# Patient Record
Sex: Female | Born: 1985
Health system: Southern US, Community
[De-identification: ages and names within clinical notes are randomized; demographics above are authoritative.]

## PROBLEM LIST (undated history)

## (undated) DIAGNOSIS — F419 Anxiety disorder, unspecified: Secondary | ICD-10-CM

## (undated) DIAGNOSIS — J45909 Unspecified asthma, uncomplicated: Secondary | ICD-10-CM

---

## 2005-01-27 ENCOUNTER — Other Ambulatory Visit: Admission: RE | Admit: 2005-01-27 | Discharge: 2005-01-27 | Payer: Self-pay | Admitting: Obstetrics & Gynecology

## 2009-07-31 ENCOUNTER — Inpatient Hospital Stay (HOSPITAL_COMMUNITY): Admission: AD | Admit: 2009-07-31 | Discharge: 2009-08-03 | Payer: Self-pay | Admitting: Obstetrics and Gynecology

## 2009-09-13 ENCOUNTER — Inpatient Hospital Stay (HOSPITAL_COMMUNITY): Admission: AD | Admit: 2009-09-13 | Discharge: 2009-09-13 | Payer: Self-pay | Admitting: Obstetrics and Gynecology

## 2009-09-24 ENCOUNTER — Inpatient Hospital Stay (HOSPITAL_COMMUNITY): Admission: AD | Admit: 2009-09-24 | Discharge: 2009-09-27 | Payer: Self-pay | Admitting: Obstetrics & Gynecology

## 2009-09-25 ENCOUNTER — Encounter (INDEPENDENT_AMBULATORY_CARE_PROVIDER_SITE_OTHER): Payer: Self-pay | Admitting: Obstetrics & Gynecology

## 2010-09-09 LAB — COMPREHENSIVE METABOLIC PANEL
ALT: 10 U/L (ref 0–35)
AST: 17 U/L (ref 0–37)
Albumin: 2.3 g/dL — ABNORMAL LOW (ref 3.5–5.2)
Albumin: 3 g/dL — ABNORMAL LOW (ref 3.5–5.2)
Alkaline Phosphatase: 119 U/L — ABNORMAL HIGH (ref 39–117)
BUN: 4 mg/dL — ABNORMAL LOW (ref 6–23)
Calcium: 8.5 mg/dL (ref 8.4–10.5)
Calcium: 9.3 mg/dL (ref 8.4–10.5)
Chloride: 106 mEq/L (ref 96–112)
Chloride: 108 mEq/L (ref 96–112)
GFR calc Af Amer: >60 mL/min (ref >60)
GFR calc Af Amer: >60 mL/min (ref >60)
GFR calc non Af Amer: >60 mL/min (ref >60)
GFR calc non Af Amer: >60 mL/min (ref >60)
Glucose, Bld: 124 mg/dL — ABNORMAL HIGH (ref 70–99)
Potassium: 3.8 mEq/L (ref 3.5–5.1)
Sodium: 136 mEq/L (ref 135–145)
Sodium: 137 mEq/L (ref 135–145)
Total Bilirubin: 0.2 mg/dL — ABNORMAL LOW (ref 0.3–1.2)
Total Bilirubin: 0.4 mg/dL (ref 0.3–1.2)
Total Protein: 6 g/dL (ref 6.0–8.3)

## 2010-09-09 LAB — CBC
HCT: 31.5 % — ABNORMAL LOW (ref 36.0–46.0)
HCT: 38.3 % (ref 36.0–46.0)
Hemoglobin: 10.5 g/dL — ABNORMAL LOW (ref 12.0–15.0)
Hemoglobin: 12.9 g/dL (ref 12.0–15.0)
MCHC: 33 g/dL (ref 30.0–36.0)
MCHC: 33.7 g/dL (ref 30.0–36.0)
Platelets: 122 10*3/uL — ABNORMAL LOW (ref 150–400)
RBC: 4.03 MIL/uL (ref 3.87–5.11)
RDW: 13.7 % (ref 11.5–15.5)
WBC: 12.6 10*3/uL — ABNORMAL HIGH (ref 4.0–10.5)

## 2010-09-09 LAB — RPR: RPR Ser Ql: NONREACTIVE

## 2010-09-09 LAB — URIC ACID: Uric Acid, Serum: 6.4 mg/dL (ref 2.4–7.0)

## 2010-09-10 LAB — WET PREP, GENITAL: Trich, Wet Prep: NONE SEEN

## 2010-09-10 LAB — URINALYSIS, ROUTINE W REFLEX MICROSCOPIC
Glucose, UA: NEGATIVE mg/dL
Hgb urine dipstick: NEGATIVE
Ketones, ur: NEGATIVE mg/dL
Nitrite: NEGATIVE
Protein, ur: NEGATIVE mg/dL

## 2010-09-10 LAB — STREP B DNA PROBE: Strep Group B Ag: POSITIVE

## 2010-09-10 LAB — URINALYSIS, MICROSCOPIC ONLY
Bilirubin Urine: NEGATIVE
Ketones, ur: NEGATIVE mg/dL
Specific Gravity, Urine: 1.025 (ref 1.005–1.030)
pH: 6 (ref 5.0–8.0)

## 2010-09-10 LAB — URINE CULTURE

## 2012-05-24 ENCOUNTER — Other Ambulatory Visit: Payer: Self-pay | Admitting: Internal Medicine

## 2013-06-21 NOTE — L&D Delivery Note (Signed)
Delivery Note  SVD viable female Apgars 8,9 over 2nd degree ML laceration.  Placenta delivered spontaneously intact with 3VC. Repair with 2-0 Chromic with good support and hemostasis noted and R/V exam confirms.  PH art was sent.  [place] cord blood was not done.  Mother and baby were doing well.  EBL 300cc  Candice Camp, MD

## 2013-07-17 LAB — OB RESULTS CONSOLE ABO/RH: RH TYPE: POSITIVE

## 2013-07-17 LAB — OB RESULTS CONSOLE RPR: RPR: NONREACTIVE

## 2013-07-17 LAB — OB RESULTS CONSOLE RUBELLA ANTIBODY, IGM: RUBELLA: IMMUNE

## 2013-07-17 LAB — OB RESULTS CONSOLE HIV ANTIBODY (ROUTINE TESTING): HIV: NONREACTIVE

## 2013-07-17 LAB — OB RESULTS CONSOLE HEPATITIS B SURFACE ANTIGEN: Hepatitis B Surface Ag: NEGATIVE

## 2013-07-17 LAB — OB RESULTS CONSOLE ANTIBODY SCREEN: Antibody Screen: NEGATIVE

## 2013-07-30 LAB — OB RESULTS CONSOLE GC/CHLAMYDIA
Chlamydia: NEGATIVE
Gonorrhea: NEGATIVE

## 2014-01-31 LAB — OB RESULTS CONSOLE GBS: STREP GROUP B AG: POSITIVE

## 2014-02-20 ENCOUNTER — Inpatient Hospital Stay (HOSPITAL_COMMUNITY)
Admission: AD | Admit: 2014-02-20 | Discharge: 2014-02-22 | DRG: 775 | Disposition: A | Discharge status: Home / Self Care | Payer: 59 | Source: Ambulatory Visit | Attending: Obstetrics and Gynecology | Admitting: Obstetrics and Gynecology

## 2014-02-20 ENCOUNTER — Encounter (HOSPITAL_COMMUNITY): Payer: Self-pay | Admitting: *Deleted

## 2014-02-20 ENCOUNTER — Encounter (HOSPITAL_COMMUNITY): Payer: 59 | Admitting: Anesthesiology

## 2014-02-20 ENCOUNTER — Inpatient Hospital Stay (HOSPITAL_COMMUNITY): Payer: 59 | Admitting: Anesthesiology

## 2014-02-20 DIAGNOSIS — Z22330 Carrier of Group B streptococcus: Secondary | ICD-10-CM | POA: Diagnosis not present

## 2014-02-20 DIAGNOSIS — D689 Coagulation defect, unspecified: Secondary | ICD-10-CM | POA: Diagnosis present

## 2014-02-20 DIAGNOSIS — O9989 Other specified diseases and conditions complicating pregnancy, childbirth and the puerperium: Secondary | ICD-10-CM

## 2014-02-20 DIAGNOSIS — Z3490 Encounter for supervision of normal pregnancy, unspecified, unspecified trimester: Secondary | ICD-10-CM

## 2014-02-20 DIAGNOSIS — O99892 Other specified diseases and conditions complicating childbirth: Secondary | ICD-10-CM | POA: Diagnosis present

## 2014-02-20 DIAGNOSIS — O99891 Other specified diseases and conditions complicating pregnancy: Secondary | ICD-10-CM | POA: Diagnosis present

## 2014-02-20 DIAGNOSIS — O9912 Other diseases of the blood and blood-forming organs and certain disorders involving the immune mechanism complicating childbirth: Secondary | ICD-10-CM

## 2014-02-20 DIAGNOSIS — D696 Thrombocytopenia, unspecified: Secondary | ICD-10-CM | POA: Diagnosis present

## 2014-02-20 HISTORY — DX: Anxiety disorder, unspecified: F41.9

## 2014-02-20 HISTORY — DX: Unspecified asthma, uncomplicated: J45.909

## 2014-02-20 LAB — CBC
HEMATOCRIT: 37 % (ref 36.0–46.0)
HEMOGLOBIN: 12.5 g/dL (ref 12.0–15.0)
MCH: 30.9 pg (ref 26.0–34.0)
MCHC: 33.8 g/dL (ref 30.0–36.0)
MCV: 91.6 fL (ref 78.0–100.0)
Platelets: 136 10*3/uL — ABNORMAL LOW (ref 150–400)
RBC: 4.04 MIL/uL (ref 3.87–5.11)
RDW: 13.7 % (ref 11.5–15.5)
WBC: 10.3 10*3/uL (ref 4.0–10.5)

## 2014-02-20 LAB — SAMPLE TO BLOOD BANK

## 2014-02-20 LAB — POCT FERN TEST: POCT Fern Test: POSITIVE

## 2014-02-20 MED ORDER — DIPHENHYDRAMINE HCL 50 MG/ML IJ SOLN: 12.5000 mg | INTRAMUSCULAR | Status: DC | PRN

## 2014-02-20 MED ORDER — FENTANYL 2.5 MCG/ML BUPIVACAINE 1/10 % EPIDURAL INFUSION (WH - ANES)
14.0000 mL/h | INTRAMUSCULAR | Status: DC | PRN
  Administered 2014-02-20 (×2): 14 mL/h via EPIDURAL
  Filled 2014-02-20 (×2): qty 125

## 2014-02-20 MED ORDER — PENICILLIN G POTASSIUM 5000000 UNITS IJ SOLR
2.5000 10*6.[IU] | INTRAVENOUS | Status: DC
  Administered 2014-02-20: 2.5 10*6.[IU] via INTRAVENOUS
  Filled 2014-02-20 (×3): qty 2.5

## 2014-02-20 MED ORDER — LIDOCAINE HCL (PF) 1 % IJ SOLN
INTRAMUSCULAR | Status: DC | PRN
  Administered 2014-02-20 (×4): 4 mL

## 2014-02-20 MED ORDER — LIDOCAINE HCL (PF) 1 % IJ SOLN
30.0000 mL | INTRAMUSCULAR | Status: DC | PRN
  Filled 2014-02-20: qty 30

## 2014-02-20 MED ORDER — OXYCODONE-ACETAMINOPHEN 5-325 MG PO TABS: 2.0000 | ORAL_TABLET | ORAL | Status: DC | PRN

## 2014-02-20 MED ORDER — TERBUTALINE SULFATE 1 MG/ML IJ SOLN: 0.2500 mg | Freq: Once | INTRAMUSCULAR | Status: AC | PRN

## 2014-02-20 MED ORDER — ACETAMINOPHEN 325 MG PO TABS: 650.0000 mg | ORAL_TABLET | ORAL | Status: DC | PRN

## 2014-02-20 MED ORDER — LACTATED RINGERS IV SOLN
INTRAVENOUS | Status: DC
  Administered 2014-02-20 (×2): via INTRAVENOUS

## 2014-02-20 MED ORDER — OXYTOCIN 40 UNITS IN LACTATED RINGERS INFUSION - SIMPLE MED
62.5000 mL/h | INTRAVENOUS | Status: DC
  Administered 2014-02-20: 62.5 mL/h via INTRAVENOUS

## 2014-02-20 MED ORDER — OXYTOCIN 40 UNITS IN LACTATED RINGERS INFUSION - SIMPLE MED
1.0000 m[IU]/min | INTRAVENOUS | Status: DC
  Administered 2014-02-20: 2 m[IU]/min via INTRAVENOUS
  Filled 2014-02-20: qty 1000

## 2014-02-20 MED ORDER — CITRIC ACID-SODIUM CITRATE 334-500 MG/5ML PO SOLN
30.0000 mL | ORAL | Status: DC | PRN
Start: 2014-02-20 — End: 2014-02-21

## 2014-02-20 MED ORDER — LACTATED RINGERS IV SOLN: 500.0000 mL | Freq: Once | INTRAVENOUS | Status: DC

## 2014-02-20 MED ORDER — PHENYLEPHRINE 40 MCG/ML (10ML) SYRINGE FOR IV PUSH (FOR BLOOD PRESSURE SUPPORT)
80.0000 ug | PREFILLED_SYRINGE | INTRAVENOUS | Status: DC | PRN
  Filled 2014-02-20: qty 2

## 2014-02-20 MED ORDER — ONDANSETRON HCL 4 MG/2ML IJ SOLN
4.0000 mg | Freq: Four times a day (QID) | INTRAMUSCULAR | Status: DC | PRN
  Administered 2014-02-20: 4 mg via INTRAVENOUS
  Filled 2014-02-20: qty 2

## 2014-02-20 MED ORDER — OXYCODONE-ACETAMINOPHEN 5-325 MG PO TABS: 1.0000 | ORAL_TABLET | ORAL | Status: DC | PRN

## 2014-02-20 MED ORDER — EPHEDRINE 5 MG/ML INJ
10.0000 mg | INTRAVENOUS | Status: DC | PRN
  Filled 2014-02-20: qty 2

## 2014-02-20 MED ORDER — LACTATED RINGERS IV SOLN
500.0000 mL | INTRAVENOUS | Status: DC | PRN
  Administered 2014-02-20: 500 mL via INTRAVENOUS

## 2014-02-20 MED ORDER — PENICILLIN G POTASSIUM 5000000 UNITS IJ SOLR
5.0000 10*6.[IU] | Freq: Once | INTRAVENOUS | Status: AC
  Administered 2014-02-20: 5 10*6.[IU] via INTRAVENOUS
  Filled 2014-02-20: qty 5

## 2014-02-20 MED ORDER — FLEET ENEMA 7-19 GM/118ML RE ENEM: 1.0000 | ENEMA | RECTAL | Status: DC | PRN

## 2014-02-20 MED ORDER — OXYTOCIN BOLUS FROM INFUSION: 500.0000 mL | INTRAVENOUS | Status: DC

## 2014-02-20 MED ORDER — PHENYLEPHRINE 40 MCG/ML (10ML) SYRINGE FOR IV PUSH (FOR BLOOD PRESSURE SUPPORT)
80.0000 ug | PREFILLED_SYRINGE | INTRAVENOUS | Status: DC | PRN
  Filled 2014-02-20 (×2): qty 10
  Filled 2014-02-20: qty 2

## 2014-02-20 NOTE — H&P (Signed)
Priscilla Porter is a 28 y.o. female presenting for SROM clear fluid 11 am.  Now with ctxs increasing in Freq and Strength.  Was seen in office yesterday and was 2/80 and had membranes stripped by Dr Vincente Poli.  GBS +,  Pregnancy uncomplicated. History OB History   Grav Para Term Preterm Abortions TAB SAB Ect Mult Living   1              No past medical history on file. No past surgical history on file. Family History: family history is not on file. Social History:  has no tobacco, alcohol, and drug history on file.   Prenatal Transfer Tool  Maternal Diabetes: No Genetic Screening: Normal Maternal Ultrasounds/Referrals: Normal Fetal Ultrasounds or other Referrals:  None Maternal Substance Abuse:  No Significant Maternal Medications:  None Significant Maternal Lab Results:  None Other Comments:  None  ROS    Blood pressure 128/80, pulse 110, temperature 98.7 F (37.1 C), temperature source Oral, resp. rate 18, height  (1.702 m), weight 94.802 kg (209 lb). Exam Physical Exam  Prenatal labs: ABO, Rh: O/Positive/-- (01/27 0000) Antibody: Negative (01/27 0000) Rubella: Immune (01/27 0000) RPR: Nonreactive (01/27 0000)  HBsAg: Negative (01/27 0000)  HIV: Non-reactive (01/27 0000)  GBS:     Assessment/Plan: IUP at term in early labor with SROM IV Abx for GBS Augment with pitocin as needed. Anticipate SVD   Suhaib Guzzo C 02/20/2014, 2:03 PM

## 2014-02-20 NOTE — Anesthesia Preprocedure Evaluation (Signed)
Anesthesia Evaluation  Patient identified by MRN, date of birth, ID band Patient awake    Reviewed: Allergy & Precautions, H&P , NPO status , Patient's Chart, lab work & pertinent test results, reviewed documented beta blocker date and time   History of Anesthesia Complications Negative for: history of anesthetic complications  Airway Mallampati: II TM Distance: >3 FB Neck ROM: full    Dental  (+) Teeth Intact   Pulmonary asthma (mild) ,  breath sounds clear to auscultation        Cardiovascular negative cardio ROS  Rhythm:regular Rate:Normal     Neuro/Psych negative neurological ROS  negative psych ROS   GI/Hepatic negative GI ROS, Neg liver ROS,   Endo/Other  negative endocrine ROS  Renal/GU negative Renal ROS     Musculoskeletal   Abdominal   Peds  Hematology negative hematology ROS (+) Thrombocytopenia - plt 136   Anesthesia Other Findings   Reproductive/Obstetrics (+) Pregnancy                           Anesthesia Physical Anesthesia Plan  ASA: II  Anesthesia Plan: Epidural   Post-op Pain Management:    Induction:   Airway Management Planned:   Additional Equipment:   Intra-op Plan:   Post-operative Plan:   Informed Consent: I have reviewed the patients History and Physical, chart, labs and discussed the procedure including the risks, benefits and alternatives for the proposed anesthesia with the patient or authorized representative who has indicated his/her understanding and acceptance.     Plan Discussed with:   Anesthesia Plan Comments:         Anesthesia Quick Evaluation

## 2014-02-20 NOTE — MAU Note (Signed)
Pt reports her water broke at 1145. Fluid is clear. Having ctx that feel like they are getting stronger. Good fetal movement reported.

## 2014-02-20 NOTE — Anesthesia Procedure Notes (Signed)
Epidural Patient location during procedure: OB Start time: 02/20/2014 4:27 PM  Staffing Anesthesiologist: Dajane Valli Performed by: anesthesiologist   Preanesthetic Checklist Completed: patient identified, site marked, surgical consent, pre-op evaluation, timeout performed, IV checked, risks and benefits discussed and monitors and equipment checked  Epidural Patient position: sitting Prep: site prepped and draped and DuraPrep Patient monitoring: continuous pulse ox and blood pressure Approach: midline Location: L3-L4 Injection technique: LOR air  Needle:  Needle type: Tuohy  Needle gauge: 17 G Needle length: 9 cm and 9 Needle insertion depth: 5.5 cm Catheter type: closed end flexible Catheter size: 19 Gauge Catheter at skin depth: 10.5 cm Test dose: negative  Assessment Events: blood not aspirated, injection not painful, no injection resistance, negative IV test and no paresthesia  Additional Notes Discussed risk of headache, infection, bleeding, nerve injury and failed or incomplete block.  Patient voices understanding and wishes to proceed.  Epidural placed easily on first attempt.  No paresthesia.  Patient tolerated procedure well with no apparent complications.  Jasmine December, MDReason for block:procedure for pain

## 2014-02-21 ENCOUNTER — Encounter (HOSPITAL_COMMUNITY): Payer: Self-pay | Admitting: *Deleted

## 2014-02-21 LAB — CBC
HCT: 34.4 % — ABNORMAL LOW (ref 36.0–46.0)
Hemoglobin: 11.6 g/dL — ABNORMAL LOW (ref 12.0–15.0)
MCH: 30.8 pg (ref 26.0–34.0)
MCHC: 33.7 g/dL (ref 30.0–36.0)
MCV: 91.2 fL (ref 78.0–100.0)
Platelets: 136 K/uL — ABNORMAL LOW (ref 150–400)
RBC: 3.77 MIL/uL — ABNORMAL LOW (ref 3.87–5.11)
RDW: 13.6 % (ref 11.5–15.5)
WBC: 16.6 K/uL — ABNORMAL HIGH (ref 4.0–10.5)

## 2014-02-21 LAB — RPR

## 2014-02-21 MED ORDER — ZOLPIDEM TARTRATE 5 MG PO TABS: 5.0000 mg | ORAL_TABLET | Freq: Every evening | ORAL | Status: DC | PRN

## 2014-02-21 MED ORDER — LANOLIN HYDROUS EX OINT: TOPICAL_OINTMENT | CUTANEOUS | Status: DC | PRN

## 2014-02-21 MED ORDER — BENZOCAINE-MENTHOL 20-0.5 % EX AERO
1.0000 | INHALATION_SPRAY | CUTANEOUS | Status: DC | PRN
Start: 2014-02-21 — End: 2014-02-22

## 2014-02-21 MED ORDER — MEASLES, MUMPS & RUBELLA VAC ~~LOC~~ INJ
0.5000 mL | INJECTION | Freq: Once | SUBCUTANEOUS | Status: DC
  Filled 2014-02-21: qty 0.5

## 2014-02-21 MED ORDER — MEDROXYPROGESTERONE ACETATE 150 MG/ML IM SUSP: 150.0000 mg | INTRAMUSCULAR | Status: DC | PRN

## 2014-02-21 MED ORDER — ONDANSETRON HCL 4 MG/2ML IJ SOLN
4.0000 mg | INTRAMUSCULAR | Status: DC | PRN
Start: 2014-02-21 — End: 2014-02-22

## 2014-02-21 MED ORDER — IBUPROFEN 600 MG PO TABS
600.0000 mg | ORAL_TABLET | Freq: Four times a day (QID) | ORAL | Status: DC
  Administered 2014-02-21 – 2014-02-22 (×6): 600 mg via ORAL
  Filled 2014-02-21 (×6): qty 1

## 2014-02-21 MED ORDER — TETANUS-DIPHTH-ACELL PERTUSSIS 5-2.5-18.5 LF-MCG/0.5 IM SUSP: 0.5000 mL | Freq: Once | INTRAMUSCULAR | Status: DC

## 2014-02-21 MED ORDER — WITCH HAZEL-GLYCERIN EX PADS
1.0000 | MEDICATED_PAD | CUTANEOUS | Status: DC | PRN
Start: 2014-02-21 — End: 2014-02-22

## 2014-02-21 MED ORDER — ALBUTEROL SULFATE (2.5 MG/3ML) 0.083% IN NEBU: 3.0000 mL | INHALATION_SOLUTION | Freq: Four times a day (QID) | RESPIRATORY_TRACT | Status: DC | PRN

## 2014-02-21 MED ORDER — DIPHENHYDRAMINE HCL 25 MG PO CAPS: 25.0000 mg | ORAL_CAPSULE | Freq: Four times a day (QID) | ORAL | Status: DC | PRN

## 2014-02-21 MED ORDER — OXYCODONE-ACETAMINOPHEN 5-325 MG PO TABS: 2.0000 | ORAL_TABLET | ORAL | Status: DC | PRN

## 2014-02-21 MED ORDER — SIMETHICONE 80 MG PO CHEW
80.0000 mg | CHEWABLE_TABLET | ORAL | Status: DC | PRN
Start: 2014-02-21 — End: 2014-02-22

## 2014-02-21 MED ORDER — DIBUCAINE 1 % RE OINT
1.0000 | TOPICAL_OINTMENT | RECTAL | Status: DC | PRN
Start: 2014-02-21 — End: 2014-02-22

## 2014-02-21 MED ORDER — OXYCODONE-ACETAMINOPHEN 5-325 MG PO TABS: 1.0000 | ORAL_TABLET | ORAL | Status: DC | PRN

## 2014-02-21 MED ORDER — ONDANSETRON HCL 4 MG PO TABS
4.0000 mg | ORAL_TABLET | ORAL | Status: DC | PRN
Start: 2014-02-21 — End: 2014-02-22

## 2014-02-21 MED ORDER — SENNOSIDES-DOCUSATE SODIUM 8.6-50 MG PO TABS
2.0000 | ORAL_TABLET | ORAL | Status: DC
  Administered 2014-02-21 (×2): 2 via ORAL
  Filled 2014-02-21 (×2): qty 2

## 2014-02-21 MED ORDER — PRENATAL MULTIVITAMIN CH
1.0000 | ORAL_TABLET | Freq: Every day | ORAL | Status: DC
  Administered 2014-02-21 – 2014-02-22 (×2): 1 via ORAL
  Filled 2014-02-21 (×2): qty 1

## 2014-02-21 NOTE — Lactation Note (Signed)
This note was copied from the chart of Priscilla Porter. Lactation Consultation Note  Patient Name: Priscilla Alayja Armas [id number] Date: 02/21/2014 Reason for consult: Initial assessment Baby 16 hours of life. Mom reports baby has been sleeping most of the day and baby has not been to breast since this morning. Enc mom to offer baby lots of STS and feed with cues and at least 8-12 times/24 hours. Assisted mom to latch baby to right breast in football position. Mom able to hand express colostrum. Mom reports baby has more difficult time latching to right breast. Right nipple is shorter/does not evert as much as left. After about 5/6 attempts, baby latches deeply, suckling rhythmically with few swallows noted. Demonstrated to mom how to compress breast for baby to latch. Discussed cluster-feeding with parents. Mom given [facility] brochure, aware of OP/BFSG and community resources. Enc mom to call for assistance with latching as needed.   Maternal Data Has patient been taught Hand Expression?: Yes Does the patient have breastfeeding experience prior to this delivery?: Yes  Feeding Feeding Type: Breast Fed Length of feed:  (LC assessed first 15 minutes of bf. )  LATCH Score/Interventions Latch: Grasps breast easily, tongue down, lips flanged, rhythmical sucking. Intervention(s): Adjust position;Assist with latch;Breast compression  Audible Swallowing: A few with stimulation  Type of Nipple: Everted at rest and after stimulation  Comfort (Breast/Nipple): Soft / non-tender     Hold (Positioning): No assistance needed to correctly position infant at breast. Intervention(s): Support Pillows;Position options;Breastfeeding basics reviewed  LATCH Score: 9  Lactation Tools Discussed/Used     Consult Status Consult Status: Follow-up Date: 02/22/14 Follow-up type: In-patient    Geralynn Ochs 02/21/2014, 3:15 PM

## 2014-02-21 NOTE — Progress Notes (Signed)
Clinical Social Work Department PSYCHOSOCIAL ASSESSMENT - MATERNAL/CHILD 02/21/2014  Patient:  Priscilla Porter, Priscilla Porter  Account Number:  [id number]  Admit Date:  02/20/2014  Ardine Eng Name:   Asencion Partridge   Clinical Social Worker:  Lucita Ferrara, CLINICAL SOCIAL WORKER   Date/Time:  02/21/2014 03:00 PM  Date Referred:  02/21/2014   Referral source  Central Nursery     Referred reason  Depression/Anxiety   Other referral source:    I:  FAMILY / HOME ENVIRONMENT Child's legal guardian:  PARENT  Guardian - Name Guardian - Age Guardian - Address  Patience Nuzzo [address], [place], [place] [address]  Georgann Housekeeper  Same as above   Other household support members/support persons Name Relationship DOB  Al Decant April 2011   Other support:   MOB shared that they have numerous family members who are supportive and live nearby.  MGM present during the assessment with MOB's consent, and attentive as CSW discussed sgns and symptoms of postpartum depression.    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  FOB is a Airline pilot.  They discussed impressions that they are well supported by their employers and both have the ability to stay at home as they transition home.   Financial resources:  Multimedia programmer If [place]:    School / Grade:  N/A Music therapist / Child Services Coordination / Early Interventions:   N/A  Cultural issues impacting care:   None reported    III  STRENGTHS Strengths  Adequate Resources  Home prepared for Child (including basic supplies)  Supportive family/friends   Strength comment:    IV  RISK FACTORS AND CURRENT PROBLEMS Current Problem:  YES   Risk Factor & Current Problem Patient Issue Family Issue Risk Factor / Current Problem Comment  Mental Illness Y N MOB reported "post  baby blues" following the birth of her daugther.  She stated that symptoms occurred for only one week.  MOB reported history of Lexapro which she took prior to her pregnancy. She denied any symptoms during her pregnancy since she stopped taking Lexapro.    V  SOCIAL WORK ASSESSMENT CSW met with MOB and FOB in order to complete the assessment. Consult ordered due to MOB presenting with a history of anxiety. MOB and FOB presented as easily engaged and receptive to the assessment.  MGM and great-grandmother present with MOB consent.  MOB acknowledged reason for CSW consult, was pleasant, and was receptive to education on postpartum depression. All family members expressed gratitude for CSW visit.   MOB expressed excitement as they discussed their thoughts and feelings related to the birth of Mitzi Hansen.  MOB displayed a bright and cheerful affect and shared that they are all excited to transition home.  CSW explored with MOB and FOB normative behaviors that their daughter may engage in as she adjusts to becoming a big sister.  CSW also normalized increase in anxiety that they may experience as they re-adjust to having a newborn in the home.  MOB shared that she is less nervous during this transition home in comparison to when her daughter is home.  MOB and FOB confirmed that they are well supported by their family, friends, and employers.   MOB's history significant for post-baby blues following the birth of her daughter, but stated that symptoms resolved within one week.  CSW reviewed education on postpartum depression, and MOB expressed willingness and intention to reach out to medical providers if she experiences symptoms.  CSW continued to inquire about mental health history.  She reported that prior to pregnancy, she was prescribed Lexapro in order to cope with "life stressors".  MOB did not further clarify stressors and CSW did not assess it necessary to further process her stressors.  MOB denied that these  life stressors continue to exist at home.  She stated that she stopped Lexapro when she learned that she was pregnant, and denied any symptoms during her pregnancy.  MOB discussed intention to re-start medication in postpartum period if needed, and denied any barriers to accessing mental health services if needed. MOB shared belief that she is able to talk to the FOB about her thoughts and feelings, and shared belief that the FOB is highly supportive of her and helpful when she begins to feel overwhelmed.   No barriers to discharge.    VI SOCIAL WORK PLAN Social Work Therapist, art  No Further Intervention Required / No Barriers to Discharge   Type of pt/family education:   Postpartum depression and anxiety   If child protective services report - county:   If child protective services report - date:   Information/referral to community resources comment:   Other social work plan:   CSW to provide emotional support PRN.

## 2014-02-21 NOTE — Progress Notes (Signed)
Post Partum Day 1 Subjective: no complaints, up ad lib, voiding and tolerating PO  Objective: Blood pressure 104/64, pulse 77, temperature 98.6 F (37 C), temperature source Oral, resp. rate 18, height  (1.702 m), weight 209 lb (94.802 kg), SpO2 100.00%, unknown if currently breastfeeding.  Physical Exam:  General: alert and cooperative Lochia: appropriate Uterine Fundus: firm Incision: perineum intact DVT Evaluation: No evidence of DVT seen on physical exam. Negative Homan's sign. No cords or calf tenderness. Calf/Ankle edema is present.   Recent Labs  02/20/14 1400 02/21/14 0610  HGB 12.5 11.6*  HCT 37.0 34.4*    Assessment/Plan: Plan for discharge tomorrow and Circumcision prior to discharge   LOS: 1 day   Priscilla Porter G 02/21/2014, 8:33 AM

## 2014-02-21 NOTE — Progress Notes (Signed)
CSW acknowledges consult for MOB's history of anxiety.  CSW attempted to meet with MOB to complete assessment but had multiple visitors.  CSW to re-attempt later today.

## 2014-02-21 NOTE — Anesthesia Postprocedure Evaluation (Signed)
Anesthesia Post Note  Patient: Priscilla Porter  Procedure(s) Performed: * No procedures listed *  Anesthesia type: Epidural  Patient location: Mother/Baby  Post pain: Pain level controlled  Post assessment: Post-op Vital signs reviewed  Last Vitals:  Filed Vitals:   02/21/14 0500  BP: 104/64  Pulse: 77  Temp: 37 C  Resp: 18    Post vital signs: Reviewed  Level of consciousness:alert  Complications: No apparent anesthesia complications

## 2014-02-22 MED ORDER — IBUPROFEN 600 MG PO TABS: 600.0000 mg | ORAL_TABLET | Freq: Four times a day (QID) | ORAL | Status: AC

## 2014-02-22 MED ORDER — PRENATAL MULTIVITAMIN CH: 1.0000 | ORAL_TABLET | Freq: Every day | ORAL | Status: AC

## 2014-02-22 NOTE — Progress Notes (Signed)
Patient counseled for circ including risk of bleeding, infection, and scarring.  All questions were answered and she wishes to proceed.    Mitchel Honour, DO

## 2014-02-22 NOTE — Discharge Summary (Signed)
Obstetric Discharge Summary Reason for Admission: onset of labor and rupture of membranes Prenatal Procedures: ultrasound Intrapartum Procedures: spontaneous vaginal delivery Postpartum Procedures: none Complications-Operative and Postpartum: 2 degree perineal laceration Hemoglobin  Date Value Ref Range Status  02/21/2014 11.6* 12.0 - 15.0 g/dL Final     HCT  Date Value Ref Range Status  02/21/2014 34.4* 36.0 - 46.0 % Final    Physical Exam:  General: alert and cooperative Lochia: appropriate Uterine Fundus: firm Incision: perineum intact DVT Evaluation: No evidence of DVT seen on physical exam. Negative Homan's sign. No cords or calf tenderness. No significant calf/ankle edema.  Discharge Diagnoses: Term Pregnancy-delivered  Discharge Information: Date: 02/22/2014 Activity: pelvic rest Diet: routine Medications: PNV and Ibuprofen Condition: stable Instructions: refer to practice specific booklet Discharge to: home   Newborn Data: Live born female  Birth Weight: 6 lb 11.8 oz (3055 g) APGAR: 8, 9  Home with mother Desires circ prior to discharge.  CURTIS,CAROL G 02/22/2014, 8:11 AM

## 2014-04-22 ENCOUNTER — Encounter (HOSPITAL_COMMUNITY): Payer: Self-pay | Admitting: *Deleted

## 2017-02-10 DIAGNOSIS — Z01419 Encounter for gynecological examination (general) (routine) without abnormal findings: Secondary | ICD-10-CM | POA: Diagnosis not present

## 2017-03-24 DIAGNOSIS — Z23 Encounter for immunization: Secondary | ICD-10-CM | POA: Diagnosis not present

## 2017-07-22 DIAGNOSIS — L659 Nonscarring hair loss, unspecified: Secondary | ICD-10-CM | POA: Diagnosis not present

## 2017-07-22 DIAGNOSIS — R5383 Other fatigue: Secondary | ICD-10-CM | POA: Diagnosis not present

## 2017-08-09 DIAGNOSIS — E039 Hypothyroidism, unspecified: Secondary | ICD-10-CM | POA: Diagnosis not present

## 2017-08-26 DIAGNOSIS — R109 Unspecified abdominal pain: Secondary | ICD-10-CM | POA: Diagnosis not present

## 2017-09-19 ENCOUNTER — Ambulatory Visit (HOSPITAL_BASED_OUTPATIENT_CLINIC_OR_DEPARTMENT_OTHER)
Admission: RE | Admit: 2017-09-19 | Discharge: 2017-09-19 | Disposition: A | Discharge status: Home / Self Care | Payer: Commercial Managed Care - HMO | Source: Ambulatory Visit | Attending: Physician Assistant | Admitting: Physician Assistant

## 2017-09-19 ENCOUNTER — Other Ambulatory Visit (HOSPITAL_BASED_OUTPATIENT_CLINIC_OR_DEPARTMENT_OTHER): Payer: Self-pay | Admitting: Physician Assistant

## 2017-09-19 DIAGNOSIS — R109 Unspecified abdominal pain: Secondary | ICD-10-CM | POA: Diagnosis not present

## 2018-04-05 DIAGNOSIS — Z6829 Body mass index (BMI) 29.0-29.9, adult: Secondary | ICD-10-CM | POA: Diagnosis not present

## 2018-04-05 DIAGNOSIS — Z01419 Encounter for gynecological examination (general) (routine) without abnormal findings: Secondary | ICD-10-CM | POA: Diagnosis not present

## 2018-05-09 DIAGNOSIS — Z23 Encounter for immunization: Secondary | ICD-10-CM | POA: Diagnosis not present

## 2019-02-08 IMAGING — CT CT RENAL STONE PROTOCOL
2 of 4 series · 17 of 46 positions shown, 19 images · non-contrast
Comparison: None.

CLINICAL DATA: Left flank pain x1 month, hematuria

EXAM:
CT ABDOMEN AND PELVIS WITHOUT CONTRAST
TECHNIQUE: Multidetector CT imaging of the abdomen and pelvis was performed
following the standard protocol without IV contrast.

[Series 2: axial st · axial · 0.86mm/px · z∈[-415,+10]mm · 14 of 93 slices shown, 16 images]
[im 4/93  soft-tissue]
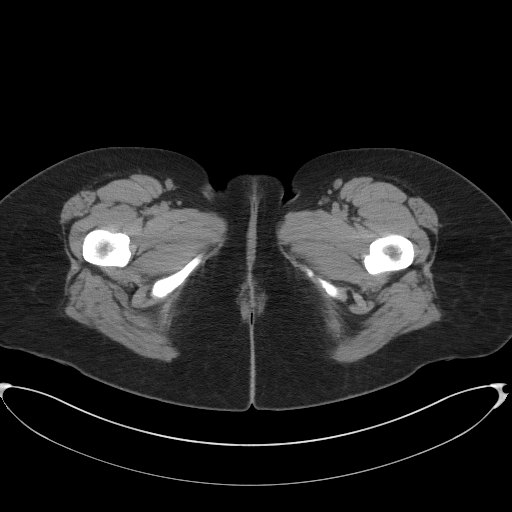
[im 4/93  bone]
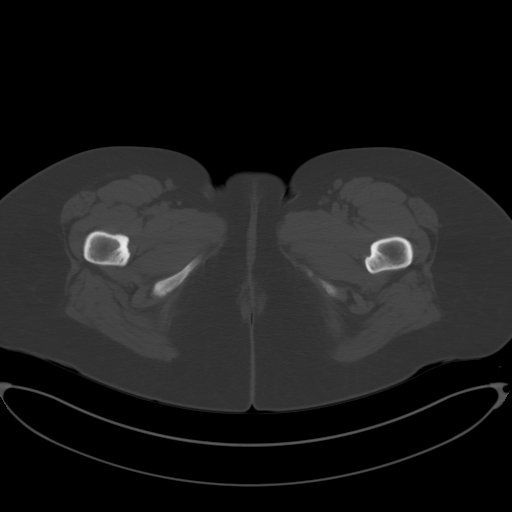
[im 12/93  soft-tissue]
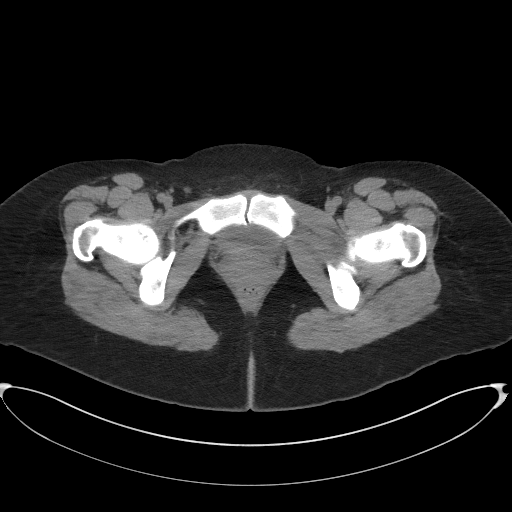
[im 20/93  soft-tissue]
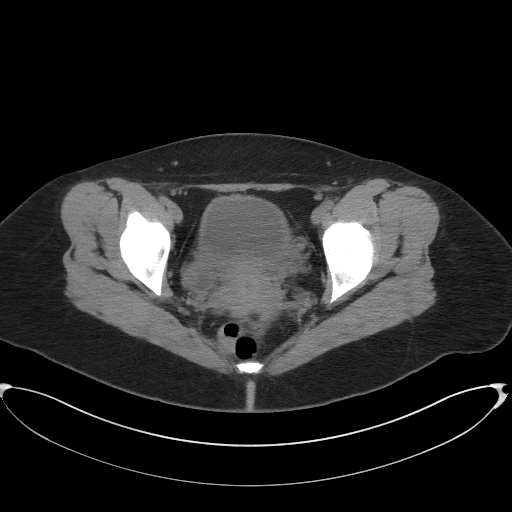
[im 24/93  soft-tissue]
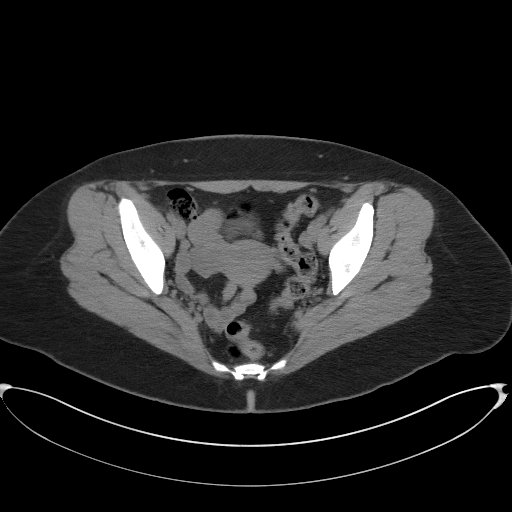
[im 31/93  soft-tissue]
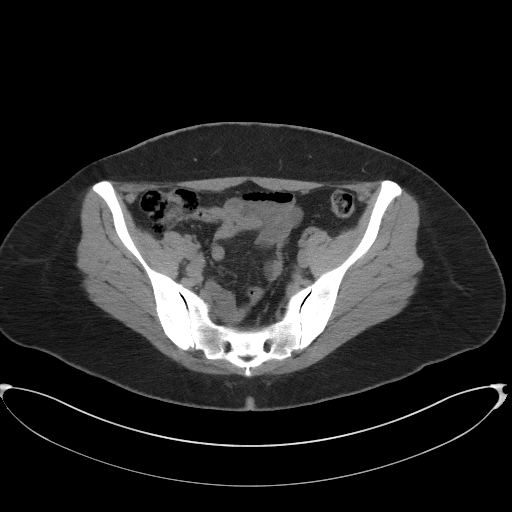
[im 39/93  soft-tissue]
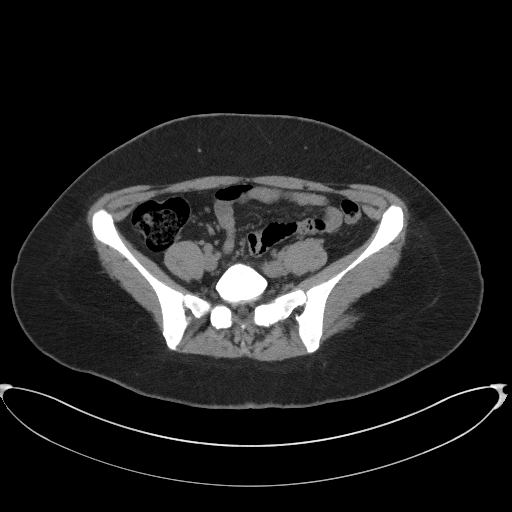
[im 43/93  soft-tissue]
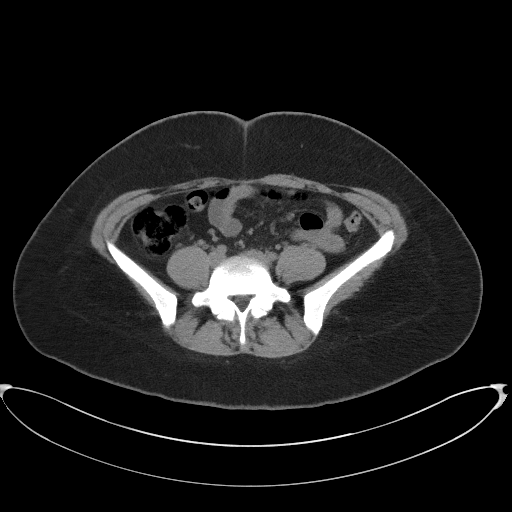
[im 50/93  soft-tissue]
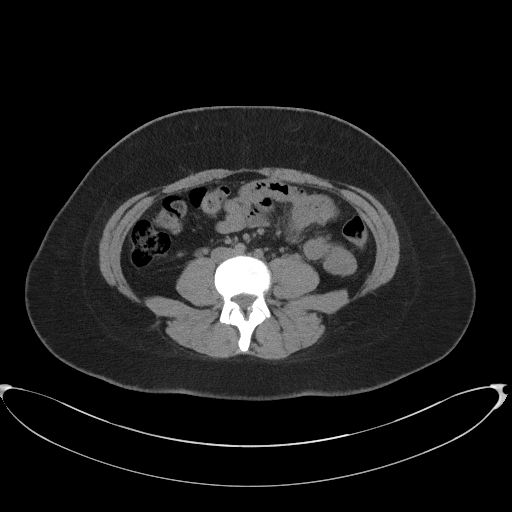
[im 54/93  soft-tissue]
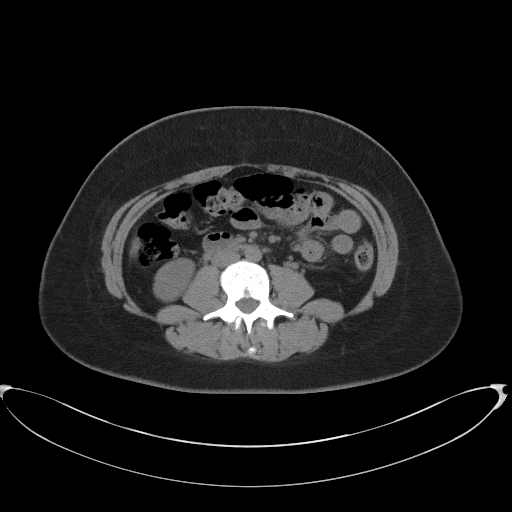
[im 54/93  bone]
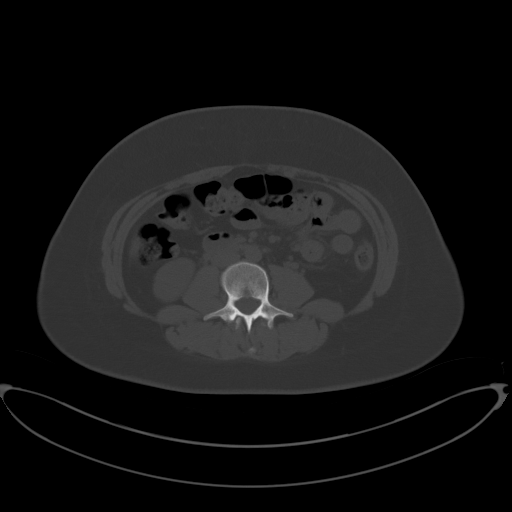
[im 62/93  soft-tissue]
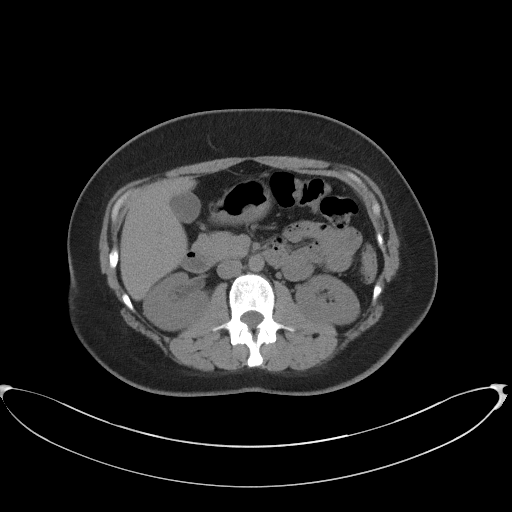
[im 70/93  soft-tissue]
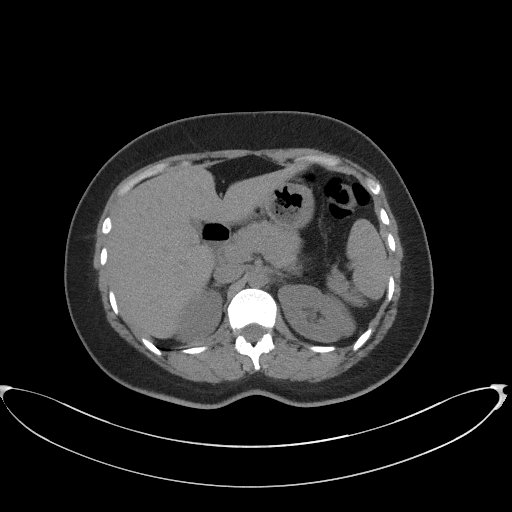
[im 73/93  soft-tissue]
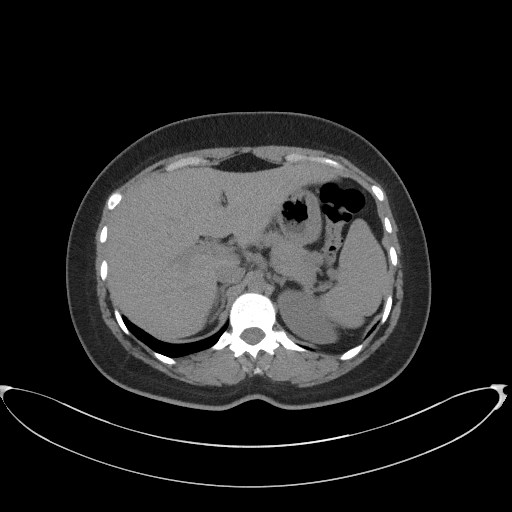
[im 81/93  soft-tissue]
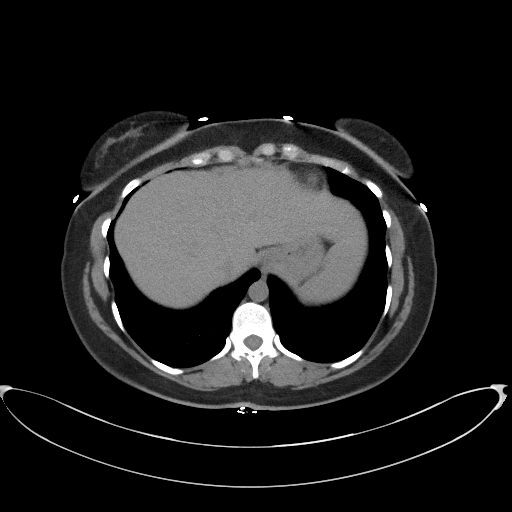
[im 89/93  soft-tissue]
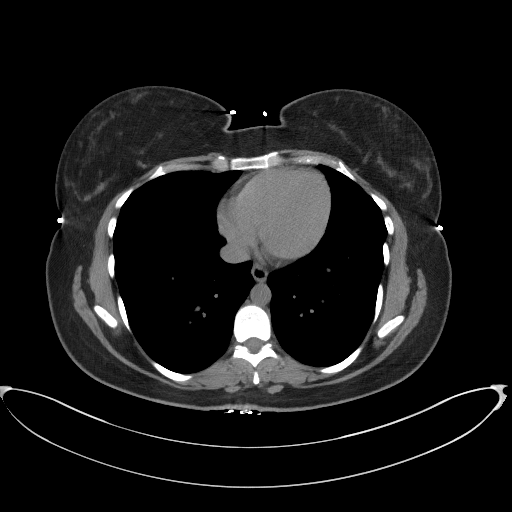

[Series 4: coronal st · coronal · 0.86mm/px · 3 of 82 slices shown]
[im 28/82  soft-tissue]
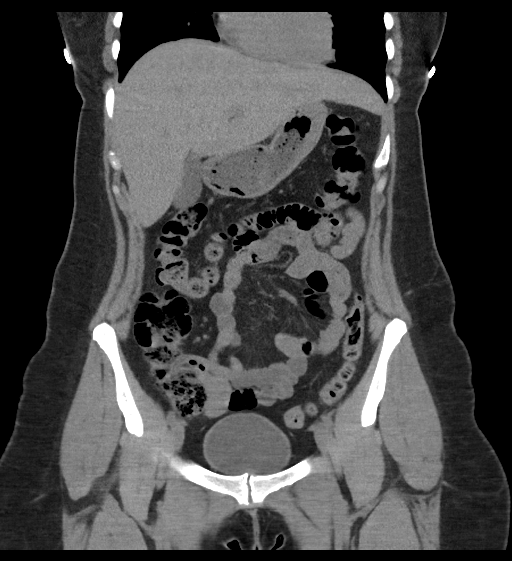
[im 37/82  soft-tissue]
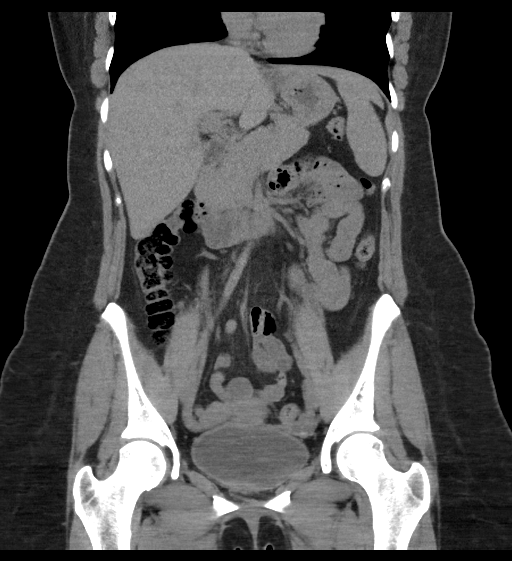
[im 46/82  soft-tissue]
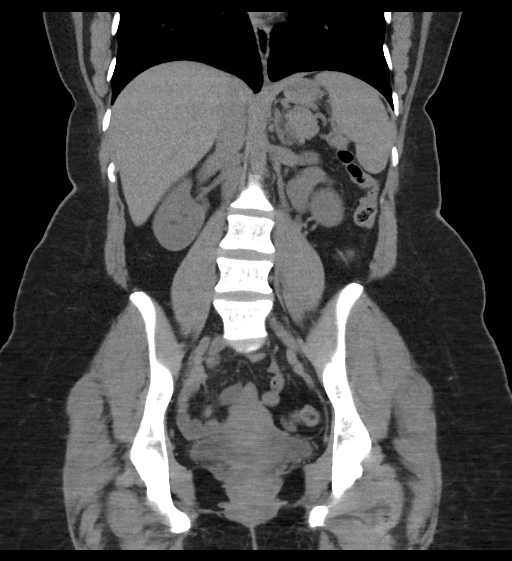

[17 of 46 positions shown; findings below may reference images not displayed]

FINDINGS: Lower chest: Lung bases are clear.

Hepatobiliary: Liver is within normal limits.

Gallbladder is unremarkable. No intrahepatic or extrahepatic ductal
dilatation.

Pancreas: Within normal limits.

Spleen: Within normal limits.

Adrenals/Urinary Tract: Adrenal glands are within normal limits.

Kidneys are within normal limits. No renal, ureteral, or bladder
calculi. No hydronephrosis.

Bladder is within normal limits.

Stomach/Bowel: Stomach is within normal limits.

No evidence of bowel obstruction.

Normal appendix (series 2/image 64).

Vascular/Lymphatic: No evidence of abdominal aortic aneurysm.

No suspicious abdominopelvic lymphadenopathy.

Reproductive: Uterus is within normal limits.

Bilateral ovaries are within normal limits.

Other: No abdominopelvic ascites.

Musculoskeletal: Visualized osseous structures are within normal
limits.
IMPRESSION: Negative CT abdomen/pelvis.

## 2023-01-10 ENCOUNTER — Other Ambulatory Visit: Payer: Self-pay

## 2023-01-10 ENCOUNTER — Emergency Department (HOSPITAL_BASED_OUTPATIENT_CLINIC_OR_DEPARTMENT_OTHER): Payer: 59

## 2023-01-10 ENCOUNTER — Emergency Department (HOSPITAL_BASED_OUTPATIENT_CLINIC_OR_DEPARTMENT_OTHER)
Admission: EM | Admit: 2023-01-10 | Discharge: 2023-01-10 | Disposition: A | Discharge status: Home / Self Care | Payer: 59 | Attending: Emergency Medicine | Admitting: Emergency Medicine

## 2023-01-10 DIAGNOSIS — S82392A Other fracture of lower end of left tibia, initial encounter for closed fracture: Secondary | ICD-10-CM | POA: Diagnosis not present

## 2023-01-10 DIAGNOSIS — S99922A Unspecified injury of left foot, initial encounter: Secondary | ICD-10-CM | POA: Diagnosis present

## 2023-01-10 DIAGNOSIS — J45909 Unspecified asthma, uncomplicated: Secondary | ICD-10-CM | POA: Insufficient documentation

## 2023-01-10 DIAGNOSIS — Y9355 Activity, bike riding: Secondary | ICD-10-CM | POA: Insufficient documentation

## 2023-01-10 MED ORDER — IBUPROFEN 400 MG PO TABS
400.0000 mg | ORAL_TABLET | Freq: Once | ORAL | Status: AC
  Administered 2023-01-10: 400 mg via ORAL
  Filled 2023-01-10: qty 1

## 2023-01-10 MED ORDER — HYDROCODONE-ACETAMINOPHEN 5-325 MG PO TABS: 1.0000 | ORAL_TABLET | Freq: Four times a day (QID) | ORAL | 0 refills | Status: AC | PRN

## 2023-01-10 MED ORDER — HYDROCODONE-ACETAMINOPHEN 5-325 MG PO TABS
1.0000 | ORAL_TABLET | Freq: Once | ORAL | Status: AC
  Administered 2023-01-10: 1 via ORAL
  Filled 2023-01-10: qty 1
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
